# Patient Record
Sex: Male | Born: 1970 | Race: Black or African American | Hispanic: No | Marital: Single | Smoking: Current some day smoker
Health system: Southern US, Community
[De-identification: ages and names within clinical notes are randomized; demographics above are authoritative.]

## PROBLEM LIST (undated history)

## (undated) DIAGNOSIS — R768 Other specified abnormal immunological findings in serum: Secondary | ICD-10-CM

## (undated) DIAGNOSIS — F329 Major depressive disorder, single episode, unspecified: Secondary | ICD-10-CM

## (undated) DIAGNOSIS — J45909 Unspecified asthma, uncomplicated: Secondary | ICD-10-CM

## (undated) DIAGNOSIS — F4310 Post-traumatic stress disorder, unspecified: Secondary | ICD-10-CM

## (undated) DIAGNOSIS — F419 Anxiety disorder, unspecified: Principal | ICD-10-CM

## (undated) HISTORY — DX: Anxiety disorder, unspecified: F41.9

## (undated) HISTORY — DX: Unspecified asthma, uncomplicated: J45.909

## (undated) HISTORY — DX: Other specified abnormal immunological findings in serum: R76.8

## (undated) HISTORY — DX: Post-traumatic stress disorder, unspecified: F43.10

## (undated) HISTORY — DX: Major depressive disorder, single episode, unspecified: F32.9

---

## 2011-11-13 ENCOUNTER — Ambulatory Visit (INDEPENDENT_AMBULATORY_CARE_PROVIDER_SITE_OTHER): Payer: 59 | Admitting: Family Medicine

## 2011-11-13 ENCOUNTER — Encounter: Payer: Self-pay | Admitting: Family Medicine

## 2011-11-13 VITALS — BP 123/73 | HR 74 | Temp 98.3°F | Resp 16 | Wt 206.0 lb

## 2011-11-13 DIAGNOSIS — J45909 Unspecified asthma, uncomplicated: Secondary | ICD-10-CM | POA: Insufficient documentation

## 2011-11-13 DIAGNOSIS — W57XXXA Bitten or stung by nonvenomous insect and other nonvenomous arthropods, initial encounter: Secondary | ICD-10-CM

## 2011-11-13 DIAGNOSIS — L0390 Cellulitis, unspecified: Secondary | ICD-10-CM

## 2011-11-13 DIAGNOSIS — L0291 Cutaneous abscess, unspecified: Secondary | ICD-10-CM

## 2011-11-13 HISTORY — DX: Unspecified asthma, uncomplicated: J45.909

## 2011-11-13 MED ORDER — CETIRIZINE HCL 10 MG PO TABS: 10.0000 mg | ORAL_TABLET | Freq: Every day | ORAL | Status: AC | PRN

## 2011-11-13 MED ORDER — TRIAMCINOLONE ACETONIDE 0.1 % EX CREA: TOPICAL_CREAM | Freq: Two times a day (BID) | CUTANEOUS | Status: DC

## 2011-11-13 MED ORDER — SULFAMETHOXAZOLE-TRIMETHOPRIM 400-80 MG PO TABS: 1.0000 | ORAL_TABLET | Freq: Two times a day (BID) | ORAL | Status: AC

## 2011-11-13 NOTE — Progress Notes (Signed)
CC: Jared Campbell is a 41 y.o. male is here for No chief complaint on file.   Subjective: HPI:  Presents to establish care has concerns of lesions on his body.  Tells me to 3 weeks ago broke out a skin eruption on the bilateral upper extremities and distal bilateral lower extremities. Described as is quite itchy. In clumps and clusters, were red and raised.  Soon after that he found out that the bed he was sleeping in was infested with bedbugs. He was given hydroxyzine, oral steroids, topical steroids, and what I assume was permethrin based on his description. Wounds are now starting to heal most appear to be scars some with scabbing. He continues to complain of intense itching is having trouble keeping from scratching himself. He's concerned about increasing redness around some of the lesions. Denies fevers chills shortness of breath cough, chest pain, with no swelling, bleeding issues, muscle or joint aches.  He carries a diagnosis of asthma, has albuterol home. States that he is not waking at night his albuterol and uses it less than 2 times a week.  Review Of Systems Outlined In HPI    History reviewed. No pertinent family history.   History  Substance Use Topics  . Smoking status: Former Smoker -- 0.5 packs/day for 7 years    Types: Cigarettes  . Smokeless tobacco: Not on file  . Alcohol Use: No     Objective: Filed Vitals:   11/13/11 1553  BP: 123/73  Pulse: 74  Temp: 98.3 F (36.8 C)  Resp: 16    General: Alert and Oriented, No Acute Distress HEENT: Pupils equal, round, reactive to light. Conjunctivae clear.  External ears unremarkable. Pink inferior turbinates.  Moist mucous membranes, pharynx without inflammation nor lesions.  Neck supple without palpable lymphadenopathy nor abnormal masses. Lungs: Clear to auscultation bilaterally, no wheezing/ronchi/rales.  Comfortable work of breathing. Good air movement. Cardiac: Regular rate and rhythm. Normal S1/S2.  No  murmurs, rubs, nor gallops.   Abdomen: soft, NTTP, no masses Extremities: No peripheral edema.  Strong peripheral pulses.  Mental Status: No depression, anxiety, nor agitation. Skin: Warm and dry. Scattered (>40) papules some hyperpigmented and scarred with a few erythematous which have up to 1cm erythema surrounding the site of skin break  Assessment & Plan: Jared Campbell was seen today for no specified reason.  Diagnoses and associated orders for this visit:  Cellulitis - sulfamethoxazole-trimethoprim (SEPTRA) 400-80 MG per tablet; Take 1 tablet by mouth 2 (two) times daily.  Bed bug bite - cetirizine (ZYRTEC) 10 MG tablet; Take 1 tablet (10 mg total) by mouth daily as needed (itching). - triamcinolone cream (KENALOG) 0.1 %; Apply topically 2 (two) times daily. For up to two weeks to help control itching at bite sites.  A relatively thorough physical exam was performed today per the patient's request however I will not factor this in to the level of billing. A more it that he might have a mild cellulitis at the site of the bed bug bites therefore he'll start on Bactrim. In her to control the itch he cannot tolerate hydroxyzine due to the sedative effect and will try Zyrtec instead with topical steroids. A message return in limb of for fasting lab visit and to discuss health maintenance topics.    Return in about 1 month (around 12/14/2011) for Annual Physical Exam.  Requested Prescriptions   Signed Prescriptions Disp Refills  . cetirizine (ZYRTEC) 10 MG tablet 30 tablet 2    Sig: Take 1 tablet (  10 mg total) by mouth daily as needed (itching).  . triamcinolone cream (KENALOG) 0.1 % 30 g 1    Sig: Apply topically 2 (two) times daily. For up to two weeks to help control itching at bite sites.  Marland Kitchen sulfamethoxazole-trimethoprim (SEPTRA) 400-80 MG per tablet 20 tablet 0    Sig: Take 1 tablet by mouth 2 (two) times daily.

## 2011-11-13 NOTE — Patient Instructions (Signed)
  TREATMENT  Localized itching   Lubrication of the skin. Use an ointment or cream or other unperfumed moisturizers if the skin is dry. Apply frequently, especially after bathing.   Anti-itch medicines. These medications may help control the urge to scratch. Scratching always makes itching worse and increases the chance of getting an infection.   Cortisone creams and ointments. These help reduce the inflammation.   Antibiotics. Skin infections can cause itching. Topical or oral antibiotics may be needed for 10 to 20 days to get rid of an infection.  If you can identify what caused the itching, avoid this substance in the future.  Widespread itching  The following measures may help to relieve itching regardless of the cause:   Wash the skin once with soap to remove irritants.   Bathe in tepid water with baking soda, cornstarch, or oatmeal.   Use calamine lotion (nonprescription) or a baking soda solution (1 teaspoon in 4 ounces of water on the skin).   Apply 1% hydrocortisone cream (no prescription needed). Do not use this if there might be a skin infection.   Avoid scratching.   Avoid itchy or tight-fitting clothes.   Avoid excessive heat, sweating, scented soaps, and swimming pools.   The lubricants, anti-itch medicines, etc. noted above may be helpful for controlling symptoms.  SEEK MEDICAL CARE IF:   The itching becomes severe.   Your itch is not better after 1 week of treatment. Contact your caregiver to schedule further evaluation.  Document Released: 03/16/2005 Document Revised: 03/05/2011 Document Reviewed: 09/03/2006 [facility] Patient Information 2012 [place], [place].

## 2011-11-16 ENCOUNTER — Telehealth: Payer: Self-pay

## 2011-11-16 ENCOUNTER — Encounter: Payer: Self-pay | Admitting: Family Medicine

## 2011-11-16 ENCOUNTER — Ambulatory Visit: Payer: Self-pay | Admitting: Family Medicine

## 2011-11-16 NOTE — Telephone Encounter (Signed)
[phone] Fax lab order to [facility].  Faxed to [facility]

## 2011-11-16 NOTE — Progress Notes (Signed)
Marylene Land,  I've placed the lab sheets for STD and DM testing at your desk per patient's request.  Dr. Judie Petit believes he can use these at any lab agency.

## 2011-11-16 NOTE — Telephone Encounter (Signed)
Awaiting a return call. Patient wants a lab request faxed to [facility] in [place].

## 2011-11-24 ENCOUNTER — Telehealth: Payer: Self-pay | Admitting: Family Medicine

## 2011-11-24 DIAGNOSIS — R768 Other specified abnormal immunological findings in serum: Secondary | ICD-10-CM

## 2011-11-24 HISTORY — DX: Other specified abnormal immunological findings in serum: R76.8

## 2011-11-24 NOTE — Telephone Encounter (Signed)
Discussed positive Hep C Ab screen, pt reports tattoos but no other RF for Hep C transmission.  Ordering viral load. Mailing results per his request.

## 2011-12-01 ENCOUNTER — Encounter: Payer: Self-pay | Admitting: Family Medicine

## 2011-12-14 ENCOUNTER — Encounter: Payer: Self-pay | Admitting: Family Medicine

## 2011-12-14 ENCOUNTER — Ambulatory Visit (INDEPENDENT_AMBULATORY_CARE_PROVIDER_SITE_OTHER): Payer: 59 | Admitting: Family Medicine

## 2011-12-14 VITALS — BP 126/83 | HR 65 | Wt 199.0 lb

## 2011-12-14 DIAGNOSIS — F329 Major depressive disorder, single episode, unspecified: Secondary | ICD-10-CM

## 2011-12-14 DIAGNOSIS — G4700 Insomnia, unspecified: Secondary | ICD-10-CM | POA: Insufficient documentation

## 2011-12-14 DIAGNOSIS — R768 Other specified abnormal immunological findings in serum: Secondary | ICD-10-CM

## 2011-12-14 DIAGNOSIS — F411 Generalized anxiety disorder: Secondary | ICD-10-CM

## 2011-12-14 DIAGNOSIS — F32A Depression, unspecified: Secondary | ICD-10-CM

## 2011-12-14 DIAGNOSIS — R894 Abnormal immunological findings in specimens from other organs, systems and tissues: Secondary | ICD-10-CM

## 2011-12-14 DIAGNOSIS — N529 Male erectile dysfunction, unspecified: Secondary | ICD-10-CM

## 2011-12-14 DIAGNOSIS — F419 Anxiety disorder, unspecified: Secondary | ICD-10-CM

## 2011-12-14 DIAGNOSIS — Z0000 Encounter for general adult medical examination without abnormal findings: Secondary | ICD-10-CM

## 2011-12-14 HISTORY — DX: Anxiety disorder, unspecified: F41.9

## 2011-12-14 HISTORY — DX: Depression, unspecified: F32.A

## 2011-12-14 MED ORDER — TADALAFIL 10 MG PO TABS: 10.0000 mg | ORAL_TABLET | ORAL | Status: DC | PRN

## 2011-12-14 MED ORDER — ZOLPIDEM TARTRATE 10 MG PO TABS: 10.0000 mg | ORAL_TABLET | Freq: Every evening | ORAL | Status: DC | PRN

## 2011-12-14 NOTE — Progress Notes (Signed)
CC: Jared Campbell is a 41 y.o. male is here for Anxiety, Depression and Insomnia   Subjective: HPI:  Patient presents with the following complaints  He tells me he carries a diagnosis of anxiety and depression, he believes it might actually be  posttraumatic stress disorder stemming from his incarceration a few months ago in a federal prison from which he was released recently do to be able to prove his innocence. He does not elaborate beyond this. He is currently seen in  Dr. Alvina Filbert  at [facility], he is getting professional counseling at that site as well. He was started on Zoloft and trazodone for depression and insomnia respectively. He states he didn't notice improvement whatsoever on his mood while on the Zoloft for approximately one to 2 weeks and due to erectile dysfunction he has since quit. He's happy with his quality of life from a psychological standpoint right now other than having trouble getting to sleep. He is unable to identify anything in particular to keep him awake at night. He describes more of an issue with getting to sleep rather than staying asleep. He denies his mind racing, fears, paranoias, obsessive thoughts that maybe can shooting to his insomnia. His significant other is here and confirms that he stays up until the early hours of the morning before falling asleep. He tried using the trazodone but it only allowed him to sleep for 3 hours. He admits to taking some of his significant other's Xanax which made him sleep for almost half a day, longer than he would've liked.  An A1c from his most recent blood work showed him at increased risk for developing type 2 diabetes. He identifies poor eating habits with consumption of junk food in simple sugars but he is in the process of cutting back on now. He would like to know what his cholesterol levels are. He denies any history of elevated blood sugar or elevated cholesterol per his knowledge. He admits to waking more than  twice in the night to the bathroom but denies polyuria polyphasia or polydipsia during the day   Hepatitis C. antibody came back positive with his most recent blood work, he has had tattoos before but believed there were performed under sterile conditions. He denies a history of sharing needles or improper exposure to blood products. He denies any right upper quadrant pain, unintentional weight loss, bleeding issues nor skin or scleral icterus. He has not had his HCV quantitative blood work performed yet  He complains of erectile dysfunction that came on the same day that he started taking Zoloft. Despite being off Zoloft for 2 weeks he still complains about inability to achieve an erection. Years ago he took a single Viagra had a good response. He denies any penile discharge, nor any swelling, lesions, discomfort in the genital region.   Review Of Systems Outlined In HPI  Past Medical History  Diagnosis Date  . Hepatitis C antibody test positive (11/18/11) 11/24/2011  . Depression 12/14/2011  . Anxiety 12/14/2011  . Asthma 11/13/2011     No family history on file.   History  Substance Use Topics  . Smoking status: Former Smoker -- 0.5 packs/day for 7 years    Types: Cigarettes  . Smokeless tobacco: Not on file  . Alcohol Use: No     Objective: Filed Vitals:   12/14/11 1121  BP: 126/83  Pulse: 65    General: Alert and Oriented, No Acute Distress HEENT: Pupils equal, round, reactive to light. Conjunctivae clear.  Pink inferior turbinates.  Moist mucous membranes, pharynx without inflammation nor lesions.  Neck supple without palpable lymphadenopathy nor abnormal masses. Lungs: Clear to auscultation bilaterally, no wheezing/ronchi/rales.  Comfortable work of breathing. Good air movement. Cardiac: Regular rate and rhythm. Normal S1/S2.  No murmurs, rubs, nor gallops.   Extremities: No peripheral edema.  Strong peripheral pulses.  Mental Status: No depression, anxiety, nor  agitation. Skin: Warm and dry.  Assessment & Plan: Abdishakur was seen today for anxiety, depression and insomnia.  Diagnoses and associated orders for this visit:  Anxiety - zolpidem (AMBIEN) 10 MG tablet; Take 1 tablet (10 mg total) by mouth at bedtime as needed for sleep.  Depression  Hepatitis c antibody test positive (11/18/11) - COMPLETE METABOLIC PANEL WITH GFR - HCV RNA quant  Routine health maintenance - Cholesterol, Total  Insomnia  Erectile dysfunction - Discontinue: tadalafil (CIALIS) 10 MG tablet; Take 1 tablet (10 mg total) by mouth as needed for erectile dysfunction. - tadalafil (CIALIS) 10 MG tablet; Take 1 tablet (10 mg total) by mouth as needed for erectile dysfunction.  Other Orders - traZODone (DESYREL) 100 MG tablet; Take 100 mg by mouth at bedtime. - sertraline (ZOLOFT) 25 MG tablet; Take 25 mg by mouth daily.    Discussed with the patient that I prefer to prescribe non-benzodiazepines for insomnia, trial of Ambien given. For his positive hep C screen we'll resubmit RNA quantitative lab request, complete metabolic panel to screen for hepatic dysfunction. For his erectile dysfunction he was given a sample for Levitra, he requested a prescription for Cialis but if he is successful with this Levitra sample he'll call back for a formal prescription. Discussed with the patient that I believe his initial erectile dysfunction was secondary to Zoloft and now it's more psychological. Cholesterol panel per the patient's request as it's been over 2 years since he believes his head on the past. He is no followup with behavioral health specialist [facility] to look further into the possibility of posterior stress disorder. I've asked him to followup in one month I will contact him in the meantime labs returned. Lab slip to been faxed to [facility] in [place]  25 minutes spent in face-to-face visit today of which at least 50% was counseling or coordinating  care.  Return in about 4 weeks (around 01/11/2012).  Requested Prescriptions   Signed Prescriptions Disp Refills  . zolpidem (AMBIEN) 10 MG tablet 30 tablet 0    Sig: Take 1 tablet (10 mg total) by mouth at bedtime as needed for sleep.  . tadalafil (CIALIS) 10 MG tablet 20 tablet 1    Sig: Take 1 tablet (10 mg total) by mouth as needed for erectile dysfunction.

## 2011-12-17 ENCOUNTER — Telehealth: Payer: Self-pay | Admitting: Family Medicine

## 2011-12-17 DIAGNOSIS — N529 Male erectile dysfunction, unspecified: Secondary | ICD-10-CM

## 2011-12-17 MED ORDER — TADALAFIL 5 MG PO TABS: ORAL_TABLET | ORAL | Status: DC

## 2011-12-17 NOTE — Telephone Encounter (Signed)
Thanks misty, it might be due to either Ambien or one of the ED medications we were going to try.

## 2011-12-17 NOTE — Telephone Encounter (Signed)
Patient called and left a voicemail stating he was seen in Monday by Dr. Ivan Anchors and he is having a problem with his Rx. He requested a call from Dr. Ivan Anchors.Marland Kitchen Thanks, Victorino Dike

## 2011-12-17 NOTE — Telephone Encounter (Signed)
Pt called and states his insurance will not cover cialis 20mg  #30.  However the pharmacist gave him a coupon for him to use for cialis 5mg  # 30  that will be covererd. Is it ok to change and send new rx for this?

## 2011-12-17 NOTE — Telephone Encounter (Signed)
Pt.notified

## 2011-12-17 NOTE — Telephone Encounter (Signed)
LMOM for pt to return call about what medication and what the issue is.

## 2011-12-17 NOTE — Telephone Encounter (Signed)
Updated his medication, sent into his [facility] on [address]

## 2011-12-18 LAB — HEPATIC FUNCTION PANEL
ALT: 51 U/L — AB (ref 10–40)
AST: 36 U/L (ref 14–40)
Alkaline Phosphatase: 40 U/L (ref 25–125)

## 2011-12-18 LAB — BASIC METABOLIC PANEL
Glucose: 100 mg/dL
Potassium: 4.5 mmol/L (ref 3.4–5.3)

## 2011-12-18 LAB — HEPATITIS C RNA QUANTITATIVE
HCV log10: 4.945
Hepatitis C Quantitation: 88072

## 2011-12-28 ENCOUNTER — Telehealth: Payer: Self-pay | Admitting: Family Medicine

## 2011-12-28 NOTE — Telephone Encounter (Signed)
LMOM informing Pt  

## 2011-12-28 NOTE — Telephone Encounter (Signed)
Call pt: CMP ok, except glucose is borderline.  One of liever enzymes is high. Rec recheck in 1 months.  TC is nl.  Still awaiting Hep C.

## 2011-12-28 NOTE — Telephone Encounter (Signed)
Call pt: CMP ok, except glucose is borderline.  One of liever enzymes is high. Rec recheck in 1 months.  TC is nl.  Still awaiting Hep C.   

## 2012-01-01 ENCOUNTER — Telehealth: Payer: Self-pay | Admitting: Family Medicine

## 2012-01-01 DIAGNOSIS — B182 Chronic viral hepatitis C: Secondary | ICD-10-CM | POA: Insufficient documentation

## 2012-01-01 NOTE — Telephone Encounter (Signed)
Contacted patient, he stated he'd rather go over his questions during an appointment.  He has already called Victorino Dike to help coordinate this.

## 2012-01-01 NOTE — Telephone Encounter (Signed)
Sue Lush, Will you please let Jared Campbell know that I'd like him to visit with a liver specialist because his blood work indeed shows that he has chronic hepatitis C.  This virus is typically passed by way of exposure to infected blood but can also be transmitted by way of unprotected sex.  A referral has been placed to our gastroenterology team, I'd encourage him to ask any recent unprotected partners to get tested for hepatitis C as well and to use condoms until he gets more feedback from the liver specialists.  Thank you.

## 2012-01-01 NOTE — Telephone Encounter (Signed)
Pt notified of results; He wanted to speak with Dr. Ivan Anchors  and asked that he  call him back. He wouldn't say why or what he wanted to discuss.

## 2012-01-04 ENCOUNTER — Encounter: Payer: Self-pay | Admitting: *Deleted

## 2012-01-11 ENCOUNTER — Encounter: Payer: Self-pay | Admitting: Family Medicine

## 2012-02-02 ENCOUNTER — Encounter: Payer: Self-pay | Admitting: Family Medicine

## 2012-02-02 ENCOUNTER — Ambulatory Visit (INDEPENDENT_AMBULATORY_CARE_PROVIDER_SITE_OTHER): Payer: 59 | Admitting: Family Medicine

## 2012-02-02 VITALS — BP 137/86 | HR 71 | Temp 98.2°F | Wt 198.0 lb

## 2012-02-02 DIAGNOSIS — N529 Male erectile dysfunction, unspecified: Secondary | ICD-10-CM | POA: Insufficient documentation

## 2012-02-02 DIAGNOSIS — B182 Chronic viral hepatitis C: Secondary | ICD-10-CM

## 2012-02-02 DIAGNOSIS — F419 Anxiety disorder, unspecified: Secondary | ICD-10-CM

## 2012-02-02 DIAGNOSIS — W57XXXA Bitten or stung by nonvenomous insect and other nonvenomous arthropods, initial encounter: Secondary | ICD-10-CM

## 2012-02-02 DIAGNOSIS — F411 Generalized anxiety disorder: Secondary | ICD-10-CM

## 2012-02-02 DIAGNOSIS — G4700 Insomnia, unspecified: Secondary | ICD-10-CM

## 2012-02-02 MED ORDER — SILDENAFIL CITRATE 100 MG PO TABS: 100.0000 mg | ORAL_TABLET | ORAL | Status: DC | PRN

## 2012-02-02 MED ORDER — ZOLPIDEM TARTRATE 10 MG PO TABS: 10.0000 mg | ORAL_TABLET | Freq: Every evening | ORAL | Status: DC | PRN

## 2012-02-02 MED ORDER — TRIAMCINOLONE ACETONIDE 0.1 % EX CREA: TOPICAL_CREAM | Freq: Two times a day (BID) | CUTANEOUS | Status: DC

## 2012-02-02 NOTE — Progress Notes (Signed)
CC: Jared Campbell is a 41 y.o. male is here for f/u stress   Subjective: HPI:  Patient presents for followup, he's been having trouble getting time off from work, it sounds as she's working close to 80 hour work weeks.  Anxiety: He continues to go to [facility] Campbell, he's happy with their program and continues on Zoloft. He believes he is transitioning back to the community well.  He gets worked up and somewhat anxious at work but does do to a high stress environment and improves as soon as he gets home. He denies depression or sadness or mood swings. He is in group therapy a day Jared Campbell.  Insomnia: He states he still wakes up once or twice in the night but is able to go back to bed easily. He started Ambien after our last visit and believes this is helping.  He denies trouble getting to sleep provided he uses Ambien.  Erectile dysfunction: He was given samples of Cialis however it did not help with erections and instead made him go to the bathroom in the middle of the night 2-3 times, since stopping the Cialis this is no longer a issue. He would like to know if there is other ED medications to try.  He denies exertional chest pain, dysuria, testicular pain, discharge from his penis.  Chronic hepatitis C: It looks as if a referral from early last month reached a dead end, I first became aware of this this morning and have continue efforts to find him a hepatologist, a new referral has been placed. He denies any history of known hep C exposure, skin or scleral discoloration, right upper quadrant pain, unintentional weight loss. He is currently sexually active but uses condoms and understands the risk of transmission. He denies alcohol use or acetaminophen use and understands the risk/stress but this would place on his liver.    Review Of Systems Outlined In HPI  Past Medical History  Diagnosis Date  . Hepatitis C antibody test positive (11/18/11) 11/24/2011  . Depression 12/14/2011  . Anxiety 12/14/2011   . Asthma 11/13/2011     No family history on file.   History  Substance Use Topics  . Smoking status: Former Smoker -- 0.5 packs/day for 7 years    Types: Cigarettes  . Smokeless tobacco: Not on file  . Alcohol Use: No     Objective: Filed Vitals:   02/02/12 1021  BP: 137/86  Pulse: 71  Temp: 98.2 F (36.8 C)    General: Alert and Oriented, No Acute Distress HEENT: Pupils equal, round, reactive to light. Conjunctivae clear.   Moist mucous membranes,.  Neck supple without palpable lymphadenopathy nor abnormal masses. Lungs: Clear to auscultation bilaterally, no wheezing/ronchi/rales.  Comfortable work of breathing. Good air movement. Cardiac: Regular rate and rhythm. Normal S1/S2.  No murmurs, rubs, nor gallops.   Abdomen: Normal bowel sounds, soft and non tender without palpable masses. Liver is not palpable Extremities: No peripheral edema.  Strong peripheral pulses.  Mental Status: No depression, anxiety, nor agitation. Skin: Warm and dry.  Assessment & Plan: Jared Campbell was seen today for f/u stress.  Diagnoses and associated orders for this visit:  Chronic hepatitis c - AMB referral to hepatitis C clinic  Bed bug bite - triamcinolone cream (KENALOG) 0.1 %; Apply topically 2 (two) times daily. For up to two weeks to help control itching at irritated skin sits.  Insomnia - zolpidem (AMBIEN) 10 MG tablet; Take 1 tablet (10 mg total) by mouth at bedtime  as needed.  Erectile dysfunction - sildenafil (VIAGRA) 100 MG tablet; Take 1 tablet (100 mg total) by mouth as needed for erectile dysfunction.  Anxiety  Other Orders - Discontinue: zolpidem (AMBIEN) 10 MG tablet; Take 10 mg by mouth at bedtime as needed.    Discussed our hepatology plan, asked him to call me if she's not heard from an appointment in 1-2 weeks. He still has a little bit itchy and flakiness at the site of remote bed bug bites, presents for refill of the steroid cream above. Insomnia appears to be  well-controlled continue Ambien. Coupons given a prescription for Viagra trial. Anxiety seems well controlled on Zoloft encouraged to continue being seen a day Jared Campbell. I've asked him to return in 3 months, sooner if needed  25 minutes spent in face-to-face visit today of which at least 75% was counseling or coordinating care.   Return in about 3 months (around 05/04/2012).

## 2012-02-05 ENCOUNTER — Ambulatory Visit: Payer: 59 | Admitting: Family Medicine

## 2012-03-03 ENCOUNTER — Telehealth: Payer: Self-pay

## 2012-03-03 NOTE — Telephone Encounter (Signed)
FYI  His mom called stating that he has an appointment on January 04/05/2012 with "Liver specialist."

## 2012-03-03 NOTE — Telephone Encounter (Signed)
noted 

## 2012-03-03 NOTE — Telephone Encounter (Signed)
See other note

## 2012-03-29 ENCOUNTER — Telehealth: Payer: Self-pay | Admitting: Family Medicine

## 2012-03-29 DIAGNOSIS — F419 Anxiety disorder, unspecified: Secondary | ICD-10-CM

## 2012-03-29 DIAGNOSIS — F329 Major depressive disorder, single episode, unspecified: Secondary | ICD-10-CM

## 2012-03-29 NOTE — Telephone Encounter (Signed)
Patient wants to discuss getting a ref from you.  He was vague with me but said you and he had spoken to you about this before. I told him you were out till next week but he said that was fine to try to speak with you next week when you return.    Thanks

## 2012-04-03 NOTE — Telephone Encounter (Signed)
Sue Lush, Will you ask Mr. Petitjean if he's referring to the Hepatitis specialist, if so there's a phone note from mother on 12/5 listing an appointment on 04/06/11.If not, what referral is he requesting. Thank you

## 2012-04-04 NOTE — Addendum Note (Signed)
Addended by: Laren Boom on: 04/04/2012 06:33 PM   Modules accepted: Orders

## 2012-04-04 NOTE — Telephone Encounter (Signed)
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 This is what I can gather from our conversation. I see that you have rx'ed his meds for insomnia and anxiety. So if you have any other ideas about where this patient can go for counseling, i think this is what he may want. He did state that he has insurance now.

## 2012-04-04 NOTE — Telephone Encounter (Signed)
Thanks for the update, I'll see if our [facility] downstairs will accept him.

## 2012-04-14 ENCOUNTER — Encounter (HOSPITAL_COMMUNITY): Payer: Self-pay | Admitting: Psychiatry

## 2012-04-14 ENCOUNTER — Other Ambulatory Visit: Payer: Self-pay | Admitting: Internal Medicine

## 2012-04-14 ENCOUNTER — Ambulatory Visit (INDEPENDENT_AMBULATORY_CARE_PROVIDER_SITE_OTHER): Payer: 59 | Admitting: Psychiatry

## 2012-04-14 VITALS — BP 120/67 | HR 81 | Ht 70.0 in | Wt 192.0 lb

## 2012-04-14 DIAGNOSIS — B1920 Unspecified viral hepatitis C without hepatic coma: Secondary | ICD-10-CM

## 2012-04-14 DIAGNOSIS — F4310 Post-traumatic stress disorder, unspecified: Secondary | ICD-10-CM

## 2012-04-14 DIAGNOSIS — F1421 Cocaine dependence, in remission: Secondary | ICD-10-CM

## 2012-04-14 DIAGNOSIS — F411 Generalized anxiety disorder: Secondary | ICD-10-CM

## 2012-04-14 DIAGNOSIS — F39 Unspecified mood [affective] disorder: Secondary | ICD-10-CM | POA: Insufficient documentation

## 2012-04-14 MED ORDER — QUETIAPINE FUMARATE 50 MG PO TABS: ORAL_TABLET | ORAL | Status: AC
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
## 2012-04-19 ENCOUNTER — Encounter: Payer: Self-pay | Admitting: Family Medicine

## 2012-05-04 ENCOUNTER — Encounter (HOSPITAL_COMMUNITY): Payer: Self-pay | Admitting: Licensed Clinical Social Worker

## 2012-05-04 ENCOUNTER — Ambulatory Visit (INDEPENDENT_AMBULATORY_CARE_PROVIDER_SITE_OTHER): Payer: 59 | Admitting: Licensed Clinical Social Worker

## 2012-05-04 DIAGNOSIS — F4310 Post-traumatic stress disorder, unspecified: Secondary | ICD-10-CM

## 2012-05-04 DIAGNOSIS — F329 Major depressive disorder, single episode, unspecified: Secondary | ICD-10-CM

## 2012-05-04 DIAGNOSIS — F39 Unspecified mood [affective] disorder: Secondary | ICD-10-CM
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
## 2012-05-12 ENCOUNTER — Ambulatory Visit (HOSPITAL_COMMUNITY): Payer: Self-pay | Admitting: Psychiatry

## 2012-05-13 ENCOUNTER — Encounter (HOSPITAL_COMMUNITY): Payer: Self-pay | Admitting: Licensed Clinical Social Worker

## 2012-05-17 ENCOUNTER — Ambulatory Visit (HOSPITAL_COMMUNITY): Payer: Self-pay | Admitting: Psychiatry

## 2012-05-18 ENCOUNTER — Ambulatory Visit (HOSPITAL_COMMUNITY): Payer: Self-pay | Admitting: Licensed Clinical Social Worker

## 2012-06-01 ENCOUNTER — Ambulatory Visit (HOSPITAL_COMMUNITY): Payer: Self-pay | Admitting: Licensed Clinical Social Worker

## 2012-06-02 ENCOUNTER — Ambulatory Visit (INDEPENDENT_AMBULATORY_CARE_PROVIDER_SITE_OTHER): Payer: 59 | Admitting: Licensed Clinical Social Worker
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
## 2012-06-04 ENCOUNTER — Other Ambulatory Visit (HOSPITAL_COMMUNITY): Payer: Self-pay | Admitting: Psychiatry

## 2012-06-22 ENCOUNTER — Ambulatory Visit (INDEPENDENT_AMBULATORY_CARE_PROVIDER_SITE_OTHER): Payer: 59 | Admitting: Licensed Clinical Social Worker

## 2012-06-22 DIAGNOSIS — F329 Major depressive disorder, single episode, unspecified: Secondary | ICD-10-CM

## 2012-06-22 DIAGNOSIS — F39 Unspecified mood [affective] disorder: Secondary | ICD-10-CM

## 2012-06-26 NOTE — Progress Notes (Signed)
   THERAPIST PROGRESS NOTE  Session Time: 11:00 - 11:50  Participation Level: Active  Behavioral Response: CasualAlertDepressed  Type of Therapy: Family Therapy  Treatment Goals addressed: Anger  Interventions: Motivational Interviewing, Supportive and Reframing  Summary: Jared Campbell is a 42 y.o. male who presents with problems related to his incarceration.  Jared Campbell came in with Jared Campbell - they have been having communciation issues.  He feels that she is the only support in his life.  He is upset with his famly who have not been supportive of him.  His GF does not see it the same way - she feels he reaches out to his family and they have been there for him.  Jared Campbell has a home here and in [place] and she is married.  Her husband knows about Jared Campbell - she does not confront her husband with Jared Campbell but she is still pretty available to Jared Campbell.  She does his laundry and she spends time with him.  But now when her husband needs her  She is also ill - she has "cancer of the blood"  She needs to get back into treatment in [place] for she had stopped treatment when Jared Campbell got out of jail but the doctor said she needs to start treatment again.  She feels she has enough on her plate and is not worried about Jared Campbell's problems.  Jared Campbell has said he want to eventually go to Jared Campbell or [place] - asked him about going with Jared Campbell.  He mainly wants to find a job that pays him enough so that he does not have to work two jobs  He is tired - not getting enough rest.  Reports he is not having nightmares any more.  He admits to being restless and he feels stuck. .    Suicidal/Homicidal: No  Therapist Response:  Believe he has not figured out how to live like he did before jail without doing anything illegal.  Plan: Return again in 2 weeks.  Diagnosis: Axis I: Mood Disorder NOS    Axis II: Deferred    Jared Campbell,JUDITH A, LCSW 06/26/2012

## 2012-06-29 ENCOUNTER — Ambulatory Visit: Payer: Self-pay | Admitting: Family Medicine

## 2012-07-06 ENCOUNTER — Ambulatory Visit (INDEPENDENT_AMBULATORY_CARE_PROVIDER_SITE_OTHER): Payer: 59 | Admitting: Licensed Clinical Social Worker

## 2012-07-06 ENCOUNTER — Ambulatory Visit: Payer: Self-pay | Admitting: Family Medicine

## 2012-07-06 DIAGNOSIS — Z0289 Encounter for other administrative examinations: Secondary | ICD-10-CM

## 2012-07-06 DIAGNOSIS — F39 Unspecified mood [affective] disorder: Secondary | ICD-10-CM
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
## 2012-08-02 ENCOUNTER — Ambulatory Visit: Payer: 59

## 2012-08-02 ENCOUNTER — Ambulatory Visit (HOSPITAL_COMMUNITY): Payer: Self-pay | Admitting: Licensed Clinical Social Worker

## 2012-08-02 ENCOUNTER — Ambulatory Visit (INDEPENDENT_AMBULATORY_CARE_PROVIDER_SITE_OTHER): Payer: 59 | Admitting: Family Medicine

## 2012-08-02 ENCOUNTER — Ambulatory Visit (HOSPITAL_BASED_OUTPATIENT_CLINIC_OR_DEPARTMENT_OTHER)
Admission: RE | Admit: 2012-08-02 | Discharge: 2012-08-02 | Disposition: A | Discharge status: Home / Self Care | Payer: 59 | Source: Ambulatory Visit | Attending: Family Medicine | Admitting: Family Medicine

## 2012-08-02 ENCOUNTER — Encounter: Payer: Self-pay | Admitting: Family Medicine

## 2012-08-02 ENCOUNTER — Other Ambulatory Visit: Payer: Self-pay | Admitting: Family Medicine

## 2012-08-02 VITALS — BP 120/79 | HR 77 | Wt 180.0 lb

## 2012-08-02 DIAGNOSIS — R634 Abnormal weight loss: Secondary | ICD-10-CM

## 2012-08-02 DIAGNOSIS — K644 Residual hemorrhoidal skin tags: Secondary | ICD-10-CM

## 2012-08-02 DIAGNOSIS — M25532 Pain in left wrist: Secondary | ICD-10-CM

## 2012-08-02 DIAGNOSIS — R3589 Other polyuria: Secondary | ICD-10-CM

## 2012-08-02 DIAGNOSIS — M25539 Pain in unspecified wrist: Secondary | ICD-10-CM | POA: Insufficient documentation

## 2012-08-02 DIAGNOSIS — R358 Other polyuria: Secondary | ICD-10-CM

## 2012-08-02 DIAGNOSIS — N400 Enlarged prostate without lower urinary tract symptoms: Secondary | ICD-10-CM

## 2012-08-02 LAB — BASIC METABOLIC PANEL WITH GFR
CO2: 26 mEq/L (ref 19–32)
Chloride: 105 mEq/L (ref 96–112)
GFR, Est Non African American: >89 mL/min
Potassium: 4.6 mEq/L (ref 3.5–5.3)

## 2012-08-02 LAB — URINALYSIS, ROUTINE W REFLEX MICROSCOPIC
Glucose, UA: NEGATIVE mg/dL
Leukocytes, UA: NEGATIVE
Nitrite: NEGATIVE
Specific Gravity, Urine: 1.02 (ref 1.005–1.030)
pH: 6 (ref 5.0–8.0)

## 2012-08-02 LAB — TSH: TSH: 0.569 u[IU]/mL (ref 0.350–4.500)

## 2012-08-02 LAB — PSA: PSA: 0.74 ng/mL (ref <=4.00)

## 2012-08-02 MED ORDER — MELOXICAM 15 MG PO TABS: 15.0000 mg | ORAL_TABLET | Freq: Every day | ORAL | Status: AC

## 2012-08-02 MED ORDER — TAMSULOSIN HCL 0.4 MG PO CAPS: 0.4000 mg | ORAL_CAPSULE | Freq: Every day | ORAL | Status: AC

## 2012-08-02 NOTE — Progress Notes (Signed)
CC: Jared Campbell is a 42 y.o. male is here for Prostate Check and left wrist pain   Subjective: HPI:  Patient complains of left wrist pain. Present for one month. It occurred one morning without preceding trauma. 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 Described as moderate in severity, described as sharp and a few inches proximal to the base of the thumb. Worse with any quick movement of the wrist. Denies swelling redness or warmth of the joint. Denies this pain ever before. No interventions as of yet. Denies weakness.  Patient complains of urinary frequency. Unsure about how often he is going but wakes more than once to urinate at night. Complains of urinary urgency. He is unsure about urinary hesitancy or sensation of incomplete voiding. This is been present for 2-3 months. He was once told his prostate was enlarged. He denies any personal or family history of prostate cancer. Denies pelvic pain, dysuria, discharge, nor testicular pain.  Patient complains of abnormal weight loss that has been going on for 3-4 months. Denies any changes in diet or exercise. Has a history of hepatitis C and is about to have followup with hepatitis C clinic not currently on therapy. HIV test was ordered when I first met him however results never returned. Denies family history of hypo-or hyperthyroidism. Denies nausea, vomiting, abdominal pain, diarrhea, constipation, nor depression.  He has self discharged all of his medications. Denies fevers, chills, trouble fighting infections, nor poorly healing wounds   Review Of Systems Outlined In HPI  Past Medical History  Diagnosis Date  . Hepatitis C antibody test positive (11/18/11) 11/24/2011  . Depression 12/14/2011  . Anxiety 12/14/2011  . Asthma 11/13/2011  . PTSD (post-traumatic stress disorder)      Family History  Problem Relation Age of Onset  . Hypertension Maternal Aunt   . Hypertension Maternal Grandmother   . Diabetes Mellitus II  Maternal Grandmother   . Cancer Maternal Grandmother      History  Substance Use Topics  . Smoking status: Current Some Day Smoker -- 0.50 packs/day for 7 years    Types: Cigarettes  . Smokeless tobacco: Not on file  . Alcohol Use: No     Objective: Filed Vitals:   08/02/12 1014  BP: 120/79  Pulse: 77    General: Alert and Oriented, No Acute Distress HEENT: Pupils equal, round, reactive to light. Conjunctivae clear.  Moist mucous membranes. Neck supple without thyromegaly Lungs: Clear to auscultation bilaterally, no wheezing/ronchi/rales.  Comfortable work of breathing. Good air movement. Cardiac: Regular rate and rhythm. Normal S1/S2.  No murmurs, rubs, nor gallops.   Abdomen: Normal bowel sounds, soft and non tender without palpable masses. Extremities: No peripheral edema.  Strong peripheral pulses.  Left wrist has no pain in anatomic snuff box but pain is reproduced with Finkelstein's. Pain is reproduced with resisted wrist flexion and extension. Full range of motion and strength throughout left wrist without swelling or redness. Pain is reproduced at radiocarpal joint with palpation Rectal: Normal rectal tone without fissure however slightly inflamed external hemorrhoid present. Mild to moderate prostate hypertrophy without nodularity or tenderness. Mental Status: No depression, anxiety, nor agitation. Skin: Warm and dry.  Assessment & Plan: [name] was seen today for prostate check and left wrist pain.  Diagnoses and associated orders for this visit:  Left wrist pain - DG Wrist Complete Left; Future - meloxicam (MOBIC) 15 MG tablet; Take 1 tablet (15 mg total) by mouth daily.  Polyuria - BASIC  METABOLIC PANEL WITH GFR - Urinalysis, Routine w reflex microscopic - PSA  Abnormal weight loss - TSH - HIV antibody  Enlarged prostate - tamsulosin (FLOMAX) 0.4 MG CAPS; Take 1 capsule (0.4 mg total) by mouth daily.  External hemorrhoid    Left wrist pain:  Suspicion for de Quervain's, will obtain x-ray to rule out bony abnormality and if negative will consider splinting. Polyuria: Suspicion of BPH however will obtain PSA and will rule out type 2 diabetes. Abnormal weight loss: Repeat HIV and obtain TSH External hemorrhoid: Start docusate 100 mg twice a day  40 minutes spent face-to-face during visit today of which at least 50% was counseling or coordinating care regarding external hemorrhoid, enlarged prostate, polyuria, abnormal weight loss, left wrist pain.   Return in about 4 weeks (around 08/30/2012).

## 2012-08-03 ENCOUNTER — Encounter: Payer: Self-pay | Admitting: Family Medicine

## 2012-08-03 ENCOUNTER — Telehealth: Payer: Self-pay | Admitting: *Deleted

## 2012-08-03 LAB — HEMOGLOBIN A1C

## 2012-08-03 NOTE — Telephone Encounter (Addendum)
Pt wants to be written out of work for the rest of the week for hemorrhoids... advised he could use Prep H or Tux pads. Pt still wanted me to ask you

## 2012-08-03 NOTE — Telephone Encounter (Signed)
Pt called and wanted to know if he could be written out of work for the rest of the week because of his hemorrhoids. He states he is having some irritation. Asked pt if he has ever used otc hemorrhoid oint like Preperation H. He says he hasn't but he would try it. Pt still wanted me to ask if you would write him out for the rest of the week

## 2012-08-03 NOTE — Telephone Encounter (Signed)
Candy with [facility] lab called and wanted to let you know the add on test HgA1C  In the system was canceled due to when labs originally drawn a lavender top tube was not done. So this will have to be done at another time

## 2012-08-03 NOTE — Telephone Encounter (Signed)
I would agree with the Prep-H option along with the docusate sodium 100mg  twice a day regimen i wrote on the post-it note for him.  If not improving by Friday I can call in Rx strength suppositories.

## 2012-08-06 LAB — HIV 1/2 CONFIRMATION: HIV-2 Ab: NEGATIVE

## 2012-08-07 ENCOUNTER — Ambulatory Visit (HOSPITAL_BASED_OUTPATIENT_CLINIC_OR_DEPARTMENT_OTHER)
Admission: RE | Admit: 2012-08-07 | Discharge: 2012-08-07 | Disposition: A | Discharge status: Home / Self Care | Payer: 59 | Source: Ambulatory Visit | Attending: Internal Medicine | Admitting: Internal Medicine

## 2012-08-07 DIAGNOSIS — B1920 Unspecified viral hepatitis C without hepatic coma: Secondary | ICD-10-CM | POA: Insufficient documentation

## 2012-08-10 ENCOUNTER — Telehealth: Payer: Self-pay | Admitting: Family Medicine

## 2012-08-10 ENCOUNTER — Encounter: Payer: Self-pay | Admitting: Family Medicine

## 2012-08-10 DIAGNOSIS — Z21 Asymptomatic human immunodeficiency virus [HIV] infection status: Secondary | ICD-10-CM | POA: Insufficient documentation

## 2012-08-10 NOTE — Telephone Encounter (Signed)
Left message on VM mobile for patient to return for further testing looking into his unintentional weight loss.  (HIV EIA Reactive, Western Blot Negative, I would like to get HIV RNA given his unintional weight loss and history of Hepatitis C)

## 2012-08-15 ENCOUNTER — Encounter: Payer: Self-pay | Admitting: Family Medicine

## 2012-08-15 ENCOUNTER — Telehealth: Payer: Self-pay | Admitting: *Deleted

## 2012-08-15 DIAGNOSIS — J45909 Unspecified asthma, uncomplicated: Secondary | ICD-10-CM

## 2012-08-15 MED ORDER — FLUTICASONE PROPIONATE HFA 110 MCG/ACT IN AERO: 2.0000 | INHALATION_SPRAY | Freq: Two times a day (BID) | RESPIRATORY_TRACT | Status: AC

## 2012-08-15 MED ORDER — ALBUTEROL SULFATE HFA 108 (90 BASE) MCG/ACT IN AERS: 2.0000 | INHALATION_SPRAY | Freq: Four times a day (QID) | RESPIRATORY_TRACT | Status: AC | PRN

## 2012-08-15 NOTE — Telephone Encounter (Signed)
Pt calls and states that he has called multiple times and left messages on Andrea's VM wanting to get results of his labs that were done on 5/6. Also has requesting a call back on his ultrasound that he had done at the [facility] med center. States he has been out of his asthma inhalers for weeks because no one would call him back to get them filled.  Pt states that he got out of prison and they had him on Flovent and albuterol and needs these filled. I explained to him that the results were in but for some reason you had not looked at them yet. Assured pt that I would let you know to take a look at his results and that I or you would get back in touch with them with results.  I apologized for the mis- communication.

## 2012-08-15 NOTE — Telephone Encounter (Signed)
Sue Lush, Will you please let Jared Campbell know that his ultrasound of the liver and abdomen did not show any abnormalities.  His liver specialist who ordered this may not have seen this result yet, it would still be wise to follow up with the liver specialist to talk about hepatitis treatments.   (Attempted to contact patient with working number, left message restating need to return for lab follow up due to abnormal HIV labs and need for repeat blood work (HIV RNA).  Inhalers sent to his [facility]. )

## 2012-08-16 ENCOUNTER — Telehealth: Payer: Self-pay | Admitting: *Deleted

## 2012-08-16 DIAGNOSIS — R634 Abnormal weight loss: Secondary | ICD-10-CM

## 2012-08-16 DIAGNOSIS — R75 Inconclusive laboratory evidence of human immunodeficiency virus [HIV]: Secondary | ICD-10-CM

## 2012-08-16 NOTE — Telephone Encounter (Signed)
Left message on number again listed as home number to call pt back in re U/S and that we have faxed lab order over to [facility]

## 2012-08-16 NOTE — Telephone Encounter (Signed)
Called and left message that labs have been faxed.

## 2012-08-16 NOTE — Telephone Encounter (Signed)
Left message on v/m to call back

## 2012-08-16 NOTE — Telephone Encounter (Signed)
Sue Lush, Can you please fax this off to the lab requested. Placed in your inbox.  Thank you

## 2012-08-16 NOTE — Telephone Encounter (Signed)
Can you please order Jared Campbell labs? Im not sure which one to order.  They want to use [facility] off of [address]. Thank you.

## 2012-08-16 NOTE — Telephone Encounter (Signed)
Called number listed as home number for pt and left detailed message with results and Hommels reccomendations on vm

## 2012-08-19 LAB — HIV ANTIBODY (ROUTINE TESTING W REFLEX): HIV: REACTIVE

## 2012-08-23 ENCOUNTER — Ambulatory Visit (HOSPITAL_COMMUNITY): Payer: Self-pay | Admitting: Licensed Clinical Social Worker

## 2012-08-24 ENCOUNTER — Encounter: Payer: Self-pay | Admitting: *Deleted

## 2012-08-25 LAB — HIV 1/2 CONFIRMATION
HIV-1 antibody: NEGATIVE
HIV-2 Ab: NEGATIVE

## 2012-09-13 ENCOUNTER — Encounter: Payer: Self-pay | Admitting: Family Medicine

## 2019-03-01 ENCOUNTER — Ambulatory Visit (INDEPENDENT_AMBULATORY_CARE_PROVIDER_SITE_OTHER): Payer: BC Managed Care – PPO

## 2019-03-01 ENCOUNTER — Other Ambulatory Visit: Payer: Self-pay | Admitting: Physician Assistant

## 2019-03-01 ENCOUNTER — Other Ambulatory Visit: Payer: Self-pay

## 2019-03-01 DIAGNOSIS — R52 Pain, unspecified: Secondary | ICD-10-CM
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
## 2019-03-01 MED ORDER — IOHEXOL 300 MG/ML  SOLN
100.0000 mL | Freq: Once | INTRAMUSCULAR | Status: AC | PRN
  Administered 2019-03-01: 100 mL via INTRAVENOUS

## 2019-04-12 ENCOUNTER — Other Ambulatory Visit: Payer: Self-pay | Admitting: Orthopedic Surgery

## 2019-04-12 DIAGNOSIS — M48061 Spinal stenosis, lumbar region without neurogenic claudication: Secondary | ICD-10-CM

## 2019-04-19 ENCOUNTER — Ambulatory Visit
Admission: RE | Admit: 2019-04-19 | Discharge: 2019-04-19 | Disposition: A | Discharge status: Home / Self Care | Payer: Worker's Compensation | Source: Ambulatory Visit | Attending: Orthopedic Surgery | Admitting: Orthopedic Surgery

## 2019-04-19 DIAGNOSIS — M48061 Spinal stenosis, lumbar region without neurogenic claudication: Secondary | ICD-10-CM

## 2019-04-19 MED ORDER — IOPAMIDOL (ISOVUE-M 200) INJECTION 41%
1.0000 mL | Freq: Once | INTRAMUSCULAR | Status: AC
  Administered 2019-04-19: 1 mL via EPIDURAL

## 2019-04-19 MED ORDER — METHYLPREDNISOLONE ACETATE 40 MG/ML INJ SUSP (RADIOLOG
120.0000 mg | Freq: Once | INTRAMUSCULAR | Status: AC
  Administered 2019-04-19: 09:00:00 120 mg via EPIDURAL

## 2019-04-19 NOTE — Discharge Instructions (Signed)

## 2019-05-08 ENCOUNTER — Other Ambulatory Visit: Payer: Self-pay | Admitting: Orthopedic Surgery

## 2019-05-08 DIAGNOSIS — G8929 Other chronic pain: Secondary | ICD-10-CM

## 2019-05-11 ENCOUNTER — Other Ambulatory Visit: Payer: Self-pay

## 2019-05-11 ENCOUNTER — Ambulatory Visit
Admission: RE | Admit: 2019-05-11 | Discharge: 2019-05-11 | Disposition: A | Discharge status: Home / Self Care | Payer: Worker's Compensation | Source: Ambulatory Visit | Attending: Orthopedic Surgery | Admitting: Orthopedic Surgery

## 2019-05-11 DIAGNOSIS — M545 Low back pain, unspecified: Secondary | ICD-10-CM

## 2019-05-11 DIAGNOSIS — G8929 Other chronic pain: Secondary | ICD-10-CM

## 2019-05-11 MED ORDER — METHYLPREDNISOLONE ACETATE 40 MG/ML INJ SUSP (RADIOLOG
120.0000 mg | Freq: Once | INTRAMUSCULAR | Status: AC
  Administered 2019-05-11: 14:00:00 120 mg via EPIDURAL

## 2019-05-11 MED ORDER — IOPAMIDOL (ISOVUE-M 200) INJECTION 41%
1.0000 mL | Freq: Once | INTRAMUSCULAR | Status: AC
  Administered 2019-05-11: 1 mL via EPIDURAL

## 2019-05-11 NOTE — Discharge Instructions (Signed)

## 2019-05-31 ENCOUNTER — Other Ambulatory Visit: Payer: Self-pay | Admitting: Orthopedic Surgery

## 2019-06-01 ENCOUNTER — Other Ambulatory Visit: Payer: Self-pay | Admitting: Orthopedic Surgery

## 2019-06-01 DIAGNOSIS — M455 Ankylosing spondylitis of thoracolumbar region: Secondary | ICD-10-CM

## 2019-06-06 ENCOUNTER — Other Ambulatory Visit: Payer: Self-pay | Admitting: Orthopedic Surgery

## 2019-06-06 ENCOUNTER — Ambulatory Visit
Admission: RE | Admit: 2019-06-06 | Discharge: 2019-06-06 | Disposition: A | Discharge status: Home / Self Care | Payer: Worker's Compensation | Source: Ambulatory Visit | Attending: Orthopedic Surgery | Admitting: Orthopedic Surgery

## 2019-06-06 DIAGNOSIS — M455 Ankylosing spondylitis of thoracolumbar region: Secondary | ICD-10-CM

## 2019-06-06 MED ORDER — IOPAMIDOL (ISOVUE-M 200) INJECTION 41%
1.0000 mL | Freq: Once | INTRAMUSCULAR | Status: AC
  Administered 2019-06-06: 1 mL via INTRA_ARTICULAR

## 2019-06-06 MED ORDER — METHYLPREDNISOLONE ACETATE 40 MG/ML INJ SUSP (RADIOLOG
120.0000 mg | Freq: Once | INTRAMUSCULAR | Status: AC
  Administered 2019-06-06: 120 mg via INTRA_ARTICULAR

## 2019-06-06 NOTE — Discharge Instructions (Signed)

## 2022-03-30 DEATH — deceased
